# Patient Record
Sex: Male | Born: 1940 | Race: White | Hispanic: No | Marital: Married | Smoking: Former smoker
Health system: Southern US, Community
[De-identification: ages and names within clinical notes are randomized; demographics above are authoritative.]

## PROBLEM LIST (undated history)

## (undated) DIAGNOSIS — M109 Gout, unspecified: Secondary | ICD-10-CM

## (undated) DIAGNOSIS — I499 Cardiac arrhythmia, unspecified: Secondary | ICD-10-CM

## (undated) DIAGNOSIS — E039 Hypothyroidism, unspecified: Secondary | ICD-10-CM

## (undated) DIAGNOSIS — H9190 Unspecified hearing loss, unspecified ear: Secondary | ICD-10-CM

## (undated) DIAGNOSIS — I10 Essential (primary) hypertension: Secondary | ICD-10-CM

## (undated) DIAGNOSIS — E119 Type 2 diabetes mellitus without complications: Secondary | ICD-10-CM

## (undated) DIAGNOSIS — G4730 Sleep apnea, unspecified: Secondary | ICD-10-CM

## (undated) DIAGNOSIS — I4891 Unspecified atrial fibrillation: Secondary | ICD-10-CM

## (undated) DIAGNOSIS — K449 Diaphragmatic hernia without obstruction or gangrene: Secondary | ICD-10-CM

## (undated) DIAGNOSIS — K219 Gastro-esophageal reflux disease without esophagitis: Secondary | ICD-10-CM

## (undated) DIAGNOSIS — R011 Cardiac murmur, unspecified: Secondary | ICD-10-CM

## (undated) DIAGNOSIS — E785 Hyperlipidemia, unspecified: Secondary | ICD-10-CM

## (undated) HISTORY — PX: NO PAST SURGERIES: SHX2092

---

## 2004-02-02 ENCOUNTER — Encounter: Payer: Self-pay | Admitting: Family Medicine

## 2004-06-06 ENCOUNTER — Encounter: Payer: Self-pay | Admitting: Internal Medicine

## 2004-07-02 ENCOUNTER — Encounter: Payer: Self-pay | Admitting: Internal Medicine

## 2004-11-06 ENCOUNTER — Encounter: Payer: Self-pay | Admitting: Internal Medicine

## 2004-12-02 ENCOUNTER — Encounter: Payer: Self-pay | Admitting: Internal Medicine

## 2005-01-01 ENCOUNTER — Encounter: Payer: Self-pay | Admitting: Internal Medicine

## 2005-04-14 ENCOUNTER — Encounter: Payer: Self-pay | Admitting: Internal Medicine

## 2005-05-04 ENCOUNTER — Encounter: Payer: Self-pay | Admitting: Internal Medicine

## 2005-06-26 ENCOUNTER — Encounter: Payer: Self-pay | Admitting: Internal Medicine

## 2005-07-02 ENCOUNTER — Encounter: Payer: Self-pay | Admitting: Internal Medicine

## 2005-07-09 ENCOUNTER — Ambulatory Visit: Payer: Self-pay | Admitting: Internal Medicine

## 2005-09-03 ENCOUNTER — Ambulatory Visit: Payer: Self-pay | Admitting: Internal Medicine

## 2005-10-13 ENCOUNTER — Encounter: Payer: Self-pay | Admitting: Internal Medicine

## 2005-11-01 ENCOUNTER — Encounter: Payer: Self-pay | Admitting: Internal Medicine

## 2005-11-10 ENCOUNTER — Ambulatory Visit: Payer: Self-pay | Admitting: Gastroenterology

## 2006-02-02 ENCOUNTER — Encounter: Payer: Self-pay | Admitting: Internal Medicine

## 2006-02-04 ENCOUNTER — Ambulatory Visit: Payer: Self-pay | Admitting: Internal Medicine

## 2006-03-04 ENCOUNTER — Ambulatory Visit: Payer: Self-pay | Admitting: Internal Medicine

## 2006-03-04 ENCOUNTER — Encounter: Payer: Self-pay | Admitting: Internal Medicine

## 2006-04-03 ENCOUNTER — Ambulatory Visit: Payer: Self-pay | Admitting: Internal Medicine

## 2006-05-04 ENCOUNTER — Ambulatory Visit: Payer: Self-pay | Admitting: Internal Medicine

## 2006-06-04 ENCOUNTER — Encounter: Payer: Self-pay | Admitting: Internal Medicine

## 2006-07-03 ENCOUNTER — Encounter: Payer: Self-pay | Admitting: Internal Medicine

## 2006-11-12 ENCOUNTER — Encounter: Payer: Self-pay | Admitting: Internal Medicine

## 2006-12-03 ENCOUNTER — Encounter: Payer: Self-pay | Admitting: Internal Medicine

## 2007-05-13 ENCOUNTER — Encounter: Payer: Self-pay | Admitting: Internal Medicine

## 2007-06-05 ENCOUNTER — Encounter: Payer: Self-pay | Admitting: Internal Medicine

## 2010-01-27 ENCOUNTER — Other Ambulatory Visit: Payer: Self-pay | Admitting: Unknown Physician Specialty

## 2010-02-19 ENCOUNTER — Ambulatory Visit: Payer: Self-pay | Admitting: Gastroenterology

## 2011-04-05 ENCOUNTER — Emergency Department: Payer: Self-pay | Admitting: *Deleted

## 2011-09-02 DEATH — deceased

## 2012-01-27 ENCOUNTER — Ambulatory Visit: Payer: Self-pay | Admitting: Gastroenterology

## 2012-11-24 ENCOUNTER — Ambulatory Visit: Payer: Self-pay | Admitting: Internal Medicine

## 2013-01-27 ENCOUNTER — Ambulatory Visit: Payer: Self-pay | Admitting: Internal Medicine

## 2016-10-14 ENCOUNTER — Encounter: Payer: Self-pay | Admitting: *Deleted

## 2016-10-16 MED ORDER — SODIUM CHLORIDE 0.9 % IJ SOLN
INTRAMUSCULAR | Status: AC
  Filled 2016-10-16: qty 50

## 2016-10-16 MED ORDER — BUPIVACAINE-EPINEPHRINE (PF) 0.5% -1:200000 IJ SOLN
INTRAMUSCULAR | Status: AC
  Filled 2016-10-16: qty 30

## 2016-10-16 MED ORDER — HEPARIN SODIUM (PORCINE) 5000 UNIT/ML IJ SOLN
INTRAMUSCULAR | Status: AC
Start: 2016-10-16 — End: ?
  Filled 2016-10-16: qty 1

## 2016-10-16 MED ORDER — LIDOCAINE HCL (PF) 1 % IJ SOLN
INTRAMUSCULAR | Status: AC
  Filled 2016-10-16: qty 30

## 2016-10-20 ENCOUNTER — Ambulatory Visit
Admission: RE | Admit: 2016-10-20 | Discharge: 2016-10-20 | Disposition: A | Discharge status: Home / Self Care | Payer: Medicare Other | Source: Ambulatory Visit | Attending: Ophthalmology | Admitting: Ophthalmology

## 2016-10-20 ENCOUNTER — Ambulatory Visit: Payer: Medicare Other | Admitting: Certified Registered Nurse Anesthetist

## 2016-10-20 ENCOUNTER — Encounter
Admission: RE | Disposition: A | Discharge status: Home / Self Care | Payer: Self-pay | Source: Ambulatory Visit | Attending: Ophthalmology

## 2016-10-20 ENCOUNTER — Encounter: Payer: Self-pay | Admitting: *Deleted

## 2016-10-20 DIAGNOSIS — R011 Cardiac murmur, unspecified: Secondary | ICD-10-CM | POA: Insufficient documentation

## 2016-10-20 DIAGNOSIS — E7800 Pure hypercholesterolemia, unspecified: Secondary | ICD-10-CM | POA: Diagnosis not present

## 2016-10-20 DIAGNOSIS — Z7901 Long term (current) use of anticoagulants: Secondary | ICD-10-CM | POA: Insufficient documentation

## 2016-10-20 DIAGNOSIS — H2512 Age-related nuclear cataract, left eye: Secondary | ICD-10-CM | POA: Insufficient documentation

## 2016-10-20 DIAGNOSIS — Z9989 Dependence on other enabling machines and devices: Secondary | ICD-10-CM | POA: Diagnosis not present

## 2016-10-20 DIAGNOSIS — E119 Type 2 diabetes mellitus without complications: Secondary | ICD-10-CM | POA: Insufficient documentation

## 2016-10-20 DIAGNOSIS — I10 Essential (primary) hypertension: Secondary | ICD-10-CM | POA: Insufficient documentation

## 2016-10-20 DIAGNOSIS — I4891 Unspecified atrial fibrillation: Secondary | ICD-10-CM | POA: Insufficient documentation

## 2016-10-20 DIAGNOSIS — K219 Gastro-esophageal reflux disease without esophagitis: Secondary | ICD-10-CM | POA: Insufficient documentation

## 2016-10-20 DIAGNOSIS — G4730 Sleep apnea, unspecified: Secondary | ICD-10-CM | POA: Diagnosis not present

## 2016-10-20 DIAGNOSIS — E039 Hypothyroidism, unspecified: Secondary | ICD-10-CM | POA: Insufficient documentation

## 2016-10-20 DIAGNOSIS — Z87891 Personal history of nicotine dependence: Secondary | ICD-10-CM | POA: Insufficient documentation

## 2016-10-20 DIAGNOSIS — M109 Gout, unspecified: Secondary | ICD-10-CM | POA: Insufficient documentation

## 2016-10-20 DIAGNOSIS — Z79899 Other long term (current) drug therapy: Secondary | ICD-10-CM | POA: Diagnosis not present

## 2016-10-20 DIAGNOSIS — Z7984 Long term (current) use of oral hypoglycemic drugs: Secondary | ICD-10-CM | POA: Diagnosis not present

## 2016-10-20 HISTORY — DX: Unspecified atrial fibrillation: I48.91

## 2016-10-20 HISTORY — DX: Hypothyroidism, unspecified: E03.9

## 2016-10-20 HISTORY — DX: Unspecified hearing loss, unspecified ear: H91.90

## 2016-10-20 HISTORY — DX: Hyperlipidemia, unspecified: E78.5

## 2016-10-20 HISTORY — DX: Gastro-esophageal reflux disease without esophagitis: K21.9

## 2016-10-20 HISTORY — DX: Essential (primary) hypertension: I10

## 2016-10-20 HISTORY — DX: Diaphragmatic hernia without obstruction or gangrene: K44.9

## 2016-10-20 HISTORY — DX: Sleep apnea, unspecified: G47.30

## 2016-10-20 HISTORY — DX: Type 2 diabetes mellitus without complications: E11.9

## 2016-10-20 HISTORY — DX: Cardiac murmur, unspecified: R01.1

## 2016-10-20 HISTORY — PX: CATARACT EXTRACTION W/PHACO: SHX586

## 2016-10-20 HISTORY — DX: Hemochromatosis, unspecified: E83.119

## 2016-10-20 HISTORY — DX: Gout, unspecified: M10.9

## 2016-10-20 LAB — GLUCOSE, CAPILLARY: Glucose-Capillary: 131 mg/dL — ABNORMAL HIGH (ref 65–99)

## 2016-10-20 SURGERY — PHACOEMULSIFICATION, CATARACT, WITH IOL INSERTION
Anesthesia: Monitor Anesthesia Care | Site: Eye | Laterality: Left | Wound class: Clean

## 2016-10-20 MED ORDER — EPINEPHRINE PF 1 MG/ML IJ SOLN
INTRAOCULAR | Status: DC | PRN
  Administered 2016-10-20: 1 mL via OPHTHALMIC

## 2016-10-20 MED ORDER — ARMC OPHTHALMIC DILATING DROPS
1.0000 "application " | OPHTHALMIC | Status: AC
  Administered 2016-10-20 (×3): 1 via OPHTHALMIC

## 2016-10-20 MED ORDER — NA CHONDROIT SULF-NA HYALURON 40-17 MG/ML IO SOLN
INTRAOCULAR | Status: DC | PRN
  Administered 2016-10-20: 1 mL via INTRAOCULAR

## 2016-10-20 MED ORDER — MOXIFLOXACIN HCL 0.5 % OP SOLN
OPHTHALMIC | Status: AC
  Filled 2016-10-20: qty 3

## 2016-10-20 MED ORDER — MOXIFLOXACIN HCL 0.5 % OP SOLN: 1.0000 [drp] | OPHTHALMIC | Status: DC | PRN

## 2016-10-20 MED ORDER — POVIDONE-IODINE 5 % OP SOLN
OPHTHALMIC | Status: AC
  Filled 2016-10-20: qty 30

## 2016-10-20 MED ORDER — ARMC OPHTHALMIC DILATING DROPS
OPHTHALMIC | Status: AC
  Administered 2016-10-20: 1 via OPHTHALMIC
  Filled 2016-10-20: qty 0.4

## 2016-10-20 MED ORDER — MIDAZOLAM HCL 2 MG/2ML IJ SOLN
INTRAMUSCULAR | Status: DC | PRN
  Administered 2016-10-20 (×2): 1 mg via INTRAVENOUS

## 2016-10-20 MED ORDER — CARBACHOL 0.01 % IO SOLN
INTRAOCULAR | Status: DC | PRN
  Administered 2016-10-20: .5 mL via INTRAOCULAR

## 2016-10-20 MED ORDER — MOXIFLOXACIN HCL 0.5 % OP SOLN
OPHTHALMIC | Status: DC | PRN
  Administered 2016-10-20: .2 mL via OPHTHALMIC

## 2016-10-20 MED ORDER — LIDOCAINE HCL (PF) 4 % IJ SOLN
INTRAOCULAR | Status: DC | PRN
  Administered 2016-10-20: 2 mL via OPHTHALMIC

## 2016-10-20 MED ORDER — MIDAZOLAM HCL 2 MG/2ML IJ SOLN
INTRAMUSCULAR | Status: AC
  Filled 2016-10-20: qty 2

## 2016-10-20 MED ORDER — LABETALOL HCL 5 MG/ML IV SOLN
INTRAVENOUS | Status: DC | PRN
  Administered 2016-10-20 (×2): 10 mg via INTRAVENOUS

## 2016-10-20 MED ORDER — EPINEPHRINE PF 1 MG/ML IJ SOLN
INTRAMUSCULAR | Status: AC
Start: 2016-10-20 — End: ?
  Filled 2016-10-20: qty 2

## 2016-10-20 MED ORDER — FENTANYL CITRATE (PF) 100 MCG/2ML IJ SOLN: 25.0000 ug | INTRAMUSCULAR | Status: DC | PRN

## 2016-10-20 MED ORDER — ONDANSETRON HCL 4 MG/2ML IJ SOLN: 4.0000 mg | Freq: Once | INTRAMUSCULAR | Status: DC | PRN

## 2016-10-20 MED ORDER — NA CHONDROIT SULF-NA HYALURON 40-17 MG/ML IO SOLN
INTRAOCULAR | Status: AC
  Filled 2016-10-20: qty 1

## 2016-10-20 MED ORDER — SODIUM CHLORIDE 0.9 % IV SOLN
INTRAVENOUS | Status: DC
  Administered 2016-10-20: 20 mL/h via INTRAVENOUS

## 2016-10-20 MED ORDER — LIDOCAINE HCL (PF) 4 % IJ SOLN
INTRAMUSCULAR | Status: AC
  Filled 2016-10-20: qty 5

## 2016-10-20 MED ORDER — FENTANYL CITRATE (PF) 100 MCG/2ML IJ SOLN
INTRAMUSCULAR | Status: AC
  Filled 2016-10-20: qty 2

## 2016-10-20 MED ORDER — POVIDONE-IODINE 5 % OP SOLN
OPHTHALMIC | Status: DC | PRN
  Administered 2016-10-20: 1 via OPHTHALMIC

## 2016-10-20 SURGICAL SUPPLY — 14 items
GLOVE BIO SURGEON STRL SZ8 (GLOVE) ×3 IMPLANT
GLOVE BIOGEL M 6.5 STRL (GLOVE) ×3 IMPLANT
GLOVE SURG LX 8.0 MICRO (GLOVE) ×2
GLOVE SURG LX STRL 8.0 MICRO (GLOVE) ×1 IMPLANT
GOWN STRL REUS W/ TWL LRG LVL3 (GOWN DISPOSABLE) ×2 IMPLANT
GOWN STRL REUS W/TWL LRG LVL3 (GOWN DISPOSABLE) ×4
LENS IOL TECNIS ITEC 21.5 (Intraocular Lens) ×3 IMPLANT
PACK CATARACT (MISCELLANEOUS) ×3 IMPLANT
PACK CATARACT BRASINGTON LX (MISCELLANEOUS) ×3 IMPLANT
SOL BSS BAG (MISCELLANEOUS) ×3
SOLUTION BSS BAG (MISCELLANEOUS) ×1 IMPLANT
SYR 5ML LL (SYRINGE) ×3 IMPLANT
WATER STERILE IRR 250ML POUR (IV SOLUTION) ×3 IMPLANT
WIPE NON LINTING 3.25X3.25 (MISCELLANEOUS) ×3 IMPLANT

## 2016-10-20 NOTE — Op Note (Signed)
PREOPERATIVE DIAGNOSIS:  Nuclear sclerotic cataract of the left eye.   POSTOPERATIVE DIAGNOSIS:  Nuclear sclerotic cataract of the left eye.   OPERATIVE PROCEDURE: Procedure(s): CATARACT EXTRACTION PHACO AND INTRAOCULAR LENS PLACEMENT (IOC)   SURGEON:  Galen ManilaWilliam Bruno Leach, MD.   ANESTHESIA:  Anesthesiologist: Yves Dillarroll, Paul, MD CRNA: Almeta MonasFletcher, Patricia, CRNA  1.      Managed anesthesia care. 2.     0.961ml of Shugarcaine was instilled following the paracentesis   COMPLICATIONS:  None.   TECHNIQUE:   Stop and chop   DESCRIPTION OF PROCEDURE:  The patient was examined and consented in the preoperative holding area where the aforementioned topical anesthesia was applied to the left eye and then brought back to the Operating Room where the left eye was prepped and draped in the usual sterile ophthalmic fashion and a lid speculum was placed. A paracentesis was created with the side port blade and the anterior chamber was filled with viscoelastic. A near clear corneal incision was performed with the steel keratome. A continuous curvilinear capsulorrhexis was performed with a cystotome followed by the capsulorrhexis forceps. Hydrodissection and hydrodelineation were carried out with BSS on a blunt cannula. The lens was removed in a stop and chop  technique and the remaining cortical material was removed with the irrigation-aspiration handpiece. The capsular bag was inflated with viscoelastic and the Technis ZCB00 lens was placed in the capsular bag without complication. The remaining viscoelastic was removed from the eye with the irrigation-aspiration handpiece. The wounds were hydrated. The anterior chamber was flushed with Miostat and the eye was inflated to physiologic pressure. 0.681ml Vigamox was placed in the anterior chamber. The wounds were found to be water tight. The eye was dressed with Vigamox. The patient was given protective glasses to wear throughout the day and a shield with which to sleep  tonight. The patient was also given drops with which to begin a drop regimen today and will follow-up with me in one day.  Implant Name Type Inv. Item Serial No. Manufacturer Lot No. LRB No. Used  LENS IOL DIOP 21.5 - [id number] Intraocular Lens LENS IOL DIOP 21.5 [id number] AMO   Left 1    Procedure(s) with comments: CATARACT EXTRACTION PHACO AND INTRAOCULAR LENS PLACEMENT (IOC) (Left) - US 00:59 AP% 11.4 CDE 6.78 fluid pack lot # [id number]  Electronically signed: Monterius Rolf LOUIS 10/20/2016 7:59 AM

## 2016-10-20 NOTE — Anesthesia Postprocedure Evaluation (Signed)
Anesthesia Post Note  Patient: LANDRUM CARBONELL  Procedure(s) Performed: Procedure(s) (LRB): CATARACT EXTRACTION PHACO AND INTRAOCULAR LENS PLACEMENT (IOC) (Left)  Patient location during evaluation: PACU Anesthesia Type: MAC Level of consciousness: awake and alert and oriented Pain management: pain level controlled Vital Signs Assessment: post-procedure vital signs reviewed and stable Respiratory status: spontaneous breathing Cardiovascular status: blood pressure returned to baseline Anesthetic complications: no     Last Vitals:  Vitals:   10/20/16 0803 10/20/16 0810  BP: 136/85 (!) 152/92  Pulse: 84   Resp: 16   Temp: (!) 35.6 C     Last Pain:  Vitals:   10/20/16 0623  TempSrc: Tympanic                 Sadi Arave

## 2016-10-20 NOTE — H&P (Signed)
All labs reviewed. Abnormal studies sent to patients PCP when indicated.  Previous H&P reviewed, patient examined, there are NO CHANGES.  Chad Herring LOUIS6/19/20187:18 AM

## 2016-10-20 NOTE — Anesthesia Preprocedure Evaluation (Signed)
Anesthesia Evaluation  Patient identified by MRN, date of birth, ID band  Airway Mallampati: II  TM Distance: <3 FB     Dental  (+) Caps   Pulmonary sleep apnea , former smoker,    Pulmonary exam normal        Cardiovascular hypertension, Pt. on medications Normal cardiovascular exam+ dysrhythmias Atrial Fibrillation + Valvular Problems/Murmurs      Neuro/Psych    GI/Hepatic   Endo/Other  diabetes, Well Controlled, Type 2Hypothyroidism   Renal/GU      Musculoskeletal   Abdominal Normal abdominal exam  (+)   Peds  Hematology   Anesthesia Other Findings   Reproductive/Obstetrics                             Anesthesia Physical Anesthesia Plan  ASA: III  Anesthesia Plan: MAC   Post-op Pain Management:    Induction: Intravenous  PONV Risk Score and Plan:   Airway Management Planned: Nasal Cannula  Additional Equipment:   Intra-op Plan:   Post-operative Plan:   Informed Consent:   Plan Discussed with: CRNA and Surgeon  Anesthesia Plan Comments:         Anesthesia Quick Evaluation

## 2016-10-20 NOTE — Transfer of Care (Signed)
Immediate Anesthesia Transfer of Care Note  Patient: Chad Herring  Procedure(s) Performed: Procedure(s) with comments: CATARACT EXTRACTION PHACO AND INTRAOCULAR LENS PLACEMENT (IOC) (Left) - [place] 00:59 AP% 11.4 CDE 6.78 fluid pack lot # [id number]  Patient Location: PACU  Anesthesia Type:MAC  Level of Consciousness: alert   Airway & Oxygen Therapy: Patient Spontanous Breathing and Patient connected to nasal cannula oxygen  Post-op Assessment: Report given to RN and Post -op Vital signs reviewed and stable  Post vital signs: Reviewed and stable  Last Vitals:  Vitals:   10/14/16 1020 10/20/16 0623  BP: (!) 149/90 (!) 147/89  Pulse: 91 98  Resp:  15  Temp:  (!) 35.9 C    Last Pain:  Vitals:   10/20/16 0623  TempSrc: Tympanic      Patients Stated Pain Goal: 0 (27/63/94 3200)  Complications: No apparent anesthesia complications

## 2016-10-20 NOTE — Discharge Instructions (Signed)
Eye Surgery Discharge Instructions  Expect mild scratchy sensation or mild soreness. DO NOT RUB YOUR EYE!  The day of surgery:  Minimal physical activity, but bed rest is not required  No reading, computer work, or close hand work  No bending, lifting, or straining.  May watch TV  For 24 hours:  No driving, legal decisions, or alcoholic beverages  Safety precautions  Eat anything you prefer: It is better to start with liquids, then soup then solid foods.  _____ Eye patch should be worn until postoperative exam tomorrow.  ____ Solar shield eyeglasses should be worn for comfort in the sunlight/patch while sleeping  Resume all regular medications including aspirin or Coumadin if these were discontinued prior to surgery. You may shower, bathe, shave, or wash your hair. Tylenol may be taken for mild discomfort.  Call your doctor if you experience significant pain, nausea, or vomiting, fever > 101 or other signs of infection. [phone] or [phone] Specific instructions:  Follow-up Information    Galen ManilaPorfilio, William, MD Follow up.   Specialty:  Ophthalmology Why:  Eulah CitizenJune20 at 8:25am Contact information: [address] [place] [address] [phone]

## 2016-10-20 NOTE — Anesthesia Post-op Follow-up Note (Cosign Needed)
Anesthesia QCDR form completed.        

## 2016-11-02 ENCOUNTER — Encounter: Payer: Self-pay | Admitting: *Deleted

## 2016-11-10 ENCOUNTER — Ambulatory Visit: Payer: Medicare Other | Admitting: Anesthesiology

## 2016-11-10 ENCOUNTER — Encounter
Admission: RE | Disposition: A | Discharge status: Home / Self Care | Payer: Self-pay | Source: Ambulatory Visit | Attending: Ophthalmology

## 2016-11-10 ENCOUNTER — Ambulatory Visit
Admission: RE | Admit: 2016-11-10 | Discharge: 2016-11-10 | Disposition: A | Discharge status: Home / Self Care | Payer: Medicare Other | Source: Ambulatory Visit | Attending: Ophthalmology | Admitting: Ophthalmology

## 2016-11-10 ENCOUNTER — Encounter: Payer: Self-pay | Admitting: *Deleted

## 2016-11-10 DIAGNOSIS — K449 Diaphragmatic hernia without obstruction or gangrene: Secondary | ICD-10-CM | POA: Insufficient documentation

## 2016-11-10 DIAGNOSIS — Z87891 Personal history of nicotine dependence: Secondary | ICD-10-CM | POA: Diagnosis not present

## 2016-11-10 DIAGNOSIS — K219 Gastro-esophageal reflux disease without esophagitis: Secondary | ICD-10-CM | POA: Insufficient documentation

## 2016-11-10 DIAGNOSIS — I10 Essential (primary) hypertension: Secondary | ICD-10-CM | POA: Insufficient documentation

## 2016-11-10 DIAGNOSIS — E039 Hypothyroidism, unspecified: Secondary | ICD-10-CM | POA: Insufficient documentation

## 2016-11-10 DIAGNOSIS — G4730 Sleep apnea, unspecified: Secondary | ICD-10-CM | POA: Insufficient documentation

## 2016-11-10 DIAGNOSIS — I4891 Unspecified atrial fibrillation: Secondary | ICD-10-CM | POA: Insufficient documentation

## 2016-11-10 DIAGNOSIS — E1136 Type 2 diabetes mellitus with diabetic cataract: Secondary | ICD-10-CM | POA: Diagnosis not present

## 2016-11-10 DIAGNOSIS — Z79899 Other long term (current) drug therapy: Secondary | ICD-10-CM | POA: Diagnosis not present

## 2016-11-10 DIAGNOSIS — M109 Gout, unspecified: Secondary | ICD-10-CM | POA: Diagnosis not present

## 2016-11-10 HISTORY — DX: Cardiac arrhythmia, unspecified: I49.9

## 2016-11-10 HISTORY — PX: CATARACT EXTRACTION W/PHACO: SHX586

## 2016-11-10 LAB — GLUCOSE, CAPILLARY: GLUCOSE-CAPILLARY: 148 mg/dL — AB (ref 65–99)

## 2016-11-10 SURGERY — PHACOEMULSIFICATION, CATARACT, WITH IOL INSERTION
Anesthesia: Monitor Anesthesia Care | Site: Eye | Laterality: Right | Wound class: Clean

## 2016-11-10 MED ORDER — ARMC OPHTHALMIC DILATING DROPS
OPHTHALMIC | Status: AC
  Administered 2016-11-10: 08:00:00
  Filled 2016-11-10: qty 0.4

## 2016-11-10 MED ORDER — OXYCODONE HCL 5 MG/5ML PO SOLN: 5.0000 mg | Freq: Once | ORAL | Status: DC | PRN

## 2016-11-10 MED ORDER — LIDOCAINE HCL (PF) 4 % IJ SOLN
INTRAOCULAR | Status: DC | PRN
  Administered 2016-11-10: 4 mL via OPHTHALMIC

## 2016-11-10 MED ORDER — MOXIFLOXACIN HCL 0.5 % OP SOLN
OPHTHALMIC | Status: AC
  Filled 2016-11-10: qty 3

## 2016-11-10 MED ORDER — POVIDONE-IODINE 5 % OP SOLN
OPHTHALMIC | Status: DC | PRN
  Administered 2016-11-10: 1 via OPHTHALMIC

## 2016-11-10 MED ORDER — FENTANYL CITRATE (PF) 100 MCG/2ML IJ SOLN
INTRAMUSCULAR | Status: AC
  Filled 2016-11-10: qty 2

## 2016-11-10 MED ORDER — FENTANYL CITRATE (PF) 100 MCG/2ML IJ SOLN
INTRAMUSCULAR | Status: DC | PRN
  Administered 2016-11-10: 50 ug via INTRAVENOUS

## 2016-11-10 MED ORDER — MIDAZOLAM HCL 2 MG/2ML IJ SOLN
INTRAMUSCULAR | Status: AC
  Filled 2016-11-10: qty 2

## 2016-11-10 MED ORDER — MIDAZOLAM HCL 2 MG/2ML IJ SOLN
INTRAMUSCULAR | Status: DC | PRN
  Administered 2016-11-10: 1 mg via INTRAVENOUS

## 2016-11-10 MED ORDER — FENTANYL CITRATE (PF) 100 MCG/2ML IJ SOLN: 25.0000 ug | INTRAMUSCULAR | Status: DC | PRN

## 2016-11-10 MED ORDER — MOXIFLOXACIN HCL 0.5 % OP SOLN
OPHTHALMIC | Status: DC | PRN
  Administered 2016-11-10: 0.2 mL via OPHTHALMIC

## 2016-11-10 MED ORDER — SODIUM CHLORIDE 0.9 % IV SOLN
INTRAVENOUS | Status: DC
  Administered 2016-11-10: 07:00:00 via INTRAVENOUS

## 2016-11-10 MED ORDER — ARMC OPHTHALMIC DILATING DROPS
1.0000 "application " | OPHTHALMIC | Status: AC
  Administered 2016-11-10 (×2): 1 via OPHTHALMIC

## 2016-11-10 MED ORDER — OXYCODONE HCL 5 MG PO TABS: 5.0000 mg | ORAL_TABLET | Freq: Once | ORAL | Status: DC | PRN

## 2016-11-10 MED ORDER — EPINEPHRINE PF 1 MG/ML IJ SOLN
INTRAMUSCULAR | Status: DC | PRN
  Administered 2016-11-10: 08:00:00 via OPHTHALMIC

## 2016-11-10 MED ORDER — CARBACHOL 0.01 % IO SOLN
INTRAOCULAR | Status: DC | PRN
  Administered 2016-11-10: 0.5 mL via INTRAOCULAR

## 2016-11-10 MED ORDER — NA CHONDROIT SULF-NA HYALURON 40-17 MG/ML IO SOLN
INTRAOCULAR | Status: DC | PRN
  Administered 2016-11-10: 1 mL via INTRAOCULAR

## 2016-11-10 MED ORDER — MOXIFLOXACIN HCL 0.5 % OP SOLN: 1.0000 [drp] | OPHTHALMIC | Status: DC | PRN

## 2016-11-10 SURGICAL SUPPLY — 16 items

## 2016-11-10 NOTE — Anesthesia Preprocedure Evaluation (Signed)
Anesthesia Evaluation  Patient identified by MRN, date of birth, ID band  Airway Mallampati: II  TM Distance: <3 FB Neck ROM: limited    Dental  (+) Caps   Pulmonary sleep apnea , former smoker,    Pulmonary exam normal        Cardiovascular hypertension, Pt. on medications Normal cardiovascular exam+ dysrhythmias Atrial Fibrillation + Valvular Problems/Murmurs      Neuro/Psych  Neuromuscular disease    GI/Hepatic hiatal hernia, GERD  ,  Endo/Other  diabetes, Well Controlled, Type 2Hypothyroidism   Renal/GU      Musculoskeletal   Abdominal Normal abdominal exam  (+)   Peds  Hematology   Anesthesia Other Findings Past Medical History: No date: Atrial fibrillation (HCC) No date: Diabetes mellitus without complication (HCC) No date: Dysrhythmia No date: Elevated lipids No date: GERD (gastroesophageal reflux disease) No date: Gout No date: Heart murmur     Comment: atrial fibrillation....coumadin No date: Hemochromatosis No date: HH (hiatus hernia) No date: HOH (hard of hearing) No date: Hypertension No date: Hypothyroidism No date: Sleep apnea     Comment: uses cpap   Reproductive/Obstetrics                             Anesthesia Physical  Anesthesia Plan  ASA: III  Anesthesia Plan: MAC   Post-op Pain Management:    Induction: Intravenous  PONV Risk Score and Plan:   Airway Management Planned: Nasal Cannula  Additional Equipment:   Intra-op Plan:   Post-operative Plan:   Informed Consent:   Plan Discussed with: CRNA and Surgeon  Anesthesia Plan Comments:         Anesthesia Quick Evaluation

## 2016-11-10 NOTE — Anesthesia Procedure Notes (Signed)
Procedure Name: MAC Date/Time: 11/10/2016 7:54 AM Performed by: Hedda Slade Pre-anesthesia Checklist: Patient identified, Emergency Drugs available, Suction available and Patient being monitored Oxygen Delivery Method: Nasal cannula

## 2016-11-10 NOTE — Op Note (Signed)
PREOPERATIVE DIAGNOSIS:  Nuclear sclerotic cataract of the right eye.   POSTOPERATIVE DIAGNOSIS:  NUCLEAR SCLEROTIC CATARACT RIGHT EYE   OPERATIVE PROCEDURE: Procedure(s): CATARACT EXTRACTION PHACO AND INTRAOCULAR LENS PLACEMENT (IOC)   SURGEON:  Galen ManilaWilliam Jachai Okazaki, MD.   ANESTHESIA:  Anesthesiologist: Piscitello, Cleda MccreedyJoseph K, MD CRNA: Karoline CaldwellStarr, Deana, CRNA  1.      Managed anesthesia care. 2.      0.51ml of Shugarcaine was instilled in the eye following the paracentesis.   COMPLICATIONS:  None.   TECHNIQUE:   Stop and chop   DESCRIPTION OF PROCEDURE:  The patient was examined and consented in the preoperative holding area where the aforementioned topical anesthesia was applied to the right eye and then brought back to the Operating Room where the right eye was prepped and draped in the usual sterile ophthalmic fashion and a lid speculum was placed. A paracentesis was created with the side port blade and the anterior chamber was filled with viscoelastic. A near clear corneal incision was performed with the steel keratome. A continuous curvilinear capsulorrhexis was performed with a cystotome followed by the capsulorrhexis forceps. Hydrodissection and hydrodelineation were carried out with BSS on a blunt cannula. The lens was removed in a stop and chop  technique and the remaining cortical material was removed with the irrigation-aspiration handpiece. The capsular bag was inflated with viscoelastic and the Technis ZCB00  lens was placed in the capsular bag without complication. The remaining viscoelastic was removed from the eye with the irrigation-aspiration handpiece. The wounds were hydrated. The anterior chamber was flushed with Miostat and the eye was inflated to physiologic pressure. 0.811ml of Vigamox was placed in the anterior chamber. The wounds were found to be water tight. The eye was dressed with Vigamox. The patient was given protective glasses to wear throughout the day and a shield with which  to sleep tonight. The patient was also given drops with which to begin a drop regimen today and will follow-up with me in one day.  Implant Name Type Inv. Item Serial No. Manufacturer Lot No. LRB No. Used  LENS IOL DIOP 21.5 - [id number] Intraocular Lens LENS IOL DIOP 21.5 [id number] AMO   Right 1   Procedure(s) with comments: CATARACT EXTRACTION PHACO AND INTRAOCULAR LENS PLACEMENT (IOC) (Right) - US 01:14.5 AP% 13.1 CDE 9.77 Fluid Pack Lot # [id number]  Electronically signed: Karia Ehresman [place] 11/10/2016 8:10 AM

## 2016-11-10 NOTE — Anesthesia Post-op Follow-up Note (Cosign Needed)
Anesthesia QCDR form completed.        

## 2016-11-10 NOTE — Anesthesia Postprocedure Evaluation (Signed)
Anesthesia Post Note  Patient: Chad Herring  Procedure(s) Performed: Procedure(s) (LRB): CATARACT EXTRACTION PHACO AND INTRAOCULAR LENS PLACEMENT (IOC) (Right)  Patient location during evaluation: PACU Anesthesia Type: MAC Level of consciousness: awake Pain management: pain level controlled Vital Signs Assessment: post-procedure vital signs reviewed and stable Respiratory status: spontaneous breathing Cardiovascular status: blood pressure returned to baseline Postop Assessment: no signs of nausea or vomiting Anesthetic complications: no     Last Vitals:  Vitals:   11/10/16 0635 11/10/16 0812  BP: (!) 152/81 (!) 146/84  Pulse: 81 74  Resp: 16 12  Temp: 36.4 C 36.5 C    Last Pain:  Vitals:   11/10/16 0812  TempSrc: Oral                 Hedda Slade

## 2016-11-10 NOTE — Discharge Instructions (Signed)
Eye Surgery Discharge Instructions  Expect mild scratchy sensation or mild soreness. DO NOT RUB YOUR EYE!  The day of surgery:  Minimal physical activity, but bed rest is not required  No reading, computer work, or close hand work  No bending, lifting, or straining.  May watch TV  For 24 hours:  No driving, legal decisions, or alcoholic beverages  Safety precautions  Eat anything you prefer: It is better to start with liquids, then soup then solid foods.  _____ Eye patch should be worn until postoperative exam tomorrow.  ____ Solar shield eyeglasses should be worn for comfort in the sunlight/patch while sleeping  Resume all regular medications including aspirin or Coumadin if these were discontinued prior to surgery. You may shower, bathe, shave, or wash your hair. Tylenol may be taken for mild discomfort.  Call your doctor if you experience significant pain, nausea, or vomiting, fever > 101 or other signs of infection. [phone] or [phone] Specific instructions:  Follow-up Information    Galen ManilaPorfilio, William, MD Follow up.   Specialty:  Ophthalmology Why:  July 11 at 9:05am Contact information: [address] [place] [address] [phone]

## 2016-11-10 NOTE — H&P (Signed)
All labs reviewed. Abnormal studies sent to patients PCP when indicated.  Previous H&P reviewed, patient examined, there are NO CHANGES.  Cesar Alf LOUIS7/10/20187:46 AM

## 2016-11-10 NOTE — Transfer of Care (Signed)
Immediate Anesthesia Transfer of Care Note  Patient: Chad Herring  Procedure(s) Performed: Procedure(s) with comments: CATARACT EXTRACTION PHACO AND INTRAOCULAR LENS PLACEMENT (IOC) (Right) - [place] 01:14.5 AP% 13.1 CDE 9.77 Fluid Pack Lot # [id number]  Patient Location: PACU  Anesthesia Type:MAC  Level of Consciousness: awake, alert  and oriented  Airway & Oxygen Therapy: Patient Spontanous Breathing  Post-op Assessment: Report given to RN and Post -op Vital signs reviewed and stable  Post vital signs: Reviewed and stable  Last Vitals:  Vitals:   11/10/16 0635 11/10/16 0812  BP: (!) 152/81 (!) 146/84  Pulse: 81 74  Resp: 16 12  Temp: 36.4 C 36.5 C    Last Pain:  Vitals:   11/10/16 0812  TempSrc: Oral         Complications: No apparent anesthesia complications

## 2020-03-26 ENCOUNTER — Other Ambulatory Visit: Payer: Self-pay | Admitting: Internal Medicine

## 2020-03-26 DIAGNOSIS — G459 Transient cerebral ischemic attack, unspecified: Secondary | ICD-10-CM

## 2020-04-05 ENCOUNTER — Other Ambulatory Visit: Payer: Self-pay | Admitting: Internal Medicine

## 2020-04-05 DIAGNOSIS — G459 Transient cerebral ischemic attack, unspecified: Secondary | ICD-10-CM

## 2020-04-09 ENCOUNTER — Other Ambulatory Visit: Payer: Self-pay

## 2020-04-09 ENCOUNTER — Ambulatory Visit
Admission: RE | Admit: 2020-04-09 | Discharge: 2020-04-09 | Disposition: A | Discharge status: Home / Self Care | Payer: Medicare Other | Source: Ambulatory Visit | Attending: Internal Medicine | Admitting: Internal Medicine

## 2020-04-09 DIAGNOSIS — G459 Transient cerebral ischemic attack, unspecified: Secondary | ICD-10-CM | POA: Insufficient documentation

## 2020-04-11 ENCOUNTER — Other Ambulatory Visit: Payer: Self-pay | Admitting: Internal Medicine

## 2020-04-11 ENCOUNTER — Other Ambulatory Visit (HOSPITAL_COMMUNITY): Payer: Self-pay | Admitting: Internal Medicine

## 2020-04-11 DIAGNOSIS — I6523 Occlusion and stenosis of bilateral carotid arteries: Secondary | ICD-10-CM

## 2020-04-16 ENCOUNTER — Other Ambulatory Visit: Payer: Self-pay

## 2020-04-16 ENCOUNTER — Ambulatory Visit
Admission: RE | Admit: 2020-04-16 | Discharge: 2020-04-16 | Disposition: A | Discharge status: Home / Self Care | Payer: Medicare Other | Source: Ambulatory Visit | Attending: Internal Medicine | Admitting: Internal Medicine

## 2020-04-16 DIAGNOSIS — G459 Transient cerebral ischemic attack, unspecified: Secondary | ICD-10-CM | POA: Insufficient documentation

## 2020-04-23 ENCOUNTER — Ambulatory Visit
Admission: RE | Admit: 2020-04-23 | Discharge: 2020-04-23 | Disposition: A | Discharge status: Home / Self Care | Payer: Medicare Other | Source: Ambulatory Visit | Attending: Internal Medicine | Admitting: Internal Medicine

## 2020-04-23 ENCOUNTER — Other Ambulatory Visit: Payer: Self-pay

## 2020-04-23 DIAGNOSIS — I6523 Occlusion and stenosis of bilateral carotid arteries: Secondary | ICD-10-CM | POA: Diagnosis present

## 2020-04-23 MED ORDER — IOHEXOL 350 MG/ML SOLN
100.0000 mL | Freq: Once | INTRAVENOUS | Status: AC | PRN
  Administered 2020-04-23: 12:00:00 75 mL via INTRAVENOUS

## 2020-04-25 ENCOUNTER — Other Ambulatory Visit: Payer: Self-pay | Admitting: Internal Medicine

## 2020-04-25 ENCOUNTER — Other Ambulatory Visit (HOSPITAL_COMMUNITY): Payer: Self-pay | Admitting: Internal Medicine

## 2020-04-25 DIAGNOSIS — R918 Other nonspecific abnormal finding of lung field: Secondary | ICD-10-CM

## 2020-05-13 ENCOUNTER — Ambulatory Visit: Payer: Medicare Other

## 2020-05-20 ENCOUNTER — Encounter (INDEPENDENT_AMBULATORY_CARE_PROVIDER_SITE_OTHER): Payer: Self-pay | Admitting: Vascular Surgery

## 2020-05-22 ENCOUNTER — Other Ambulatory Visit: Payer: Self-pay

## 2020-05-22 ENCOUNTER — Ambulatory Visit
Admission: RE | Admit: 2020-05-22 | Discharge: 2020-05-22 | Disposition: A | Discharge status: Home / Self Care | Payer: Medicare Other | Source: Ambulatory Visit | Attending: Internal Medicine | Admitting: Internal Medicine

## 2020-05-22 DIAGNOSIS — R918 Other nonspecific abnormal finding of lung field: Secondary | ICD-10-CM | POA: Insufficient documentation

## 2020-05-24 ENCOUNTER — Telehealth: Payer: Self-pay | Admitting: Pulmonary Disease

## 2020-05-24 NOTE — Telephone Encounter (Signed)
[facility]:  We were contacted by the [facility].  Recommendations/Concern: Increasing right upper lobe nodule Appointment requested: urgent referral recommended  Time frame: Next availble  Franne G[facility] 05/24/2020 12:35 PM

## 2020-05-24 NOTE — Telephone Encounter (Signed)
Called and left message on pt vm to call back to schedule next available lung nodule appt with RB or BI -pr

## 2020-05-24 NOTE — Telephone Encounter (Signed)
-----   Message from Novella Olive sent at 05/24/2020 12:02 PM EST ----- Regarding: Radiology Follow Up Suggestion of slight increase in size of a 2.4 x 1.7 cm ground-glass nodule within the right apex. Finding concerning for malignancy. Additional imaging evaluation or consultation with Pulmonology or Thoracic Surgery recommended.

## 2020-06-03 NOTE — Telephone Encounter (Signed)
Called and left message on pt vm to call back to schedule pulmonary consult for lung nodule. _ 2nd attempt-pr

## 2020-07-11 ENCOUNTER — Ambulatory Visit (INDEPENDENT_AMBULATORY_CARE_PROVIDER_SITE_OTHER): Payer: Self-pay | Admitting: Vascular Surgery

## 2021-06-18 ENCOUNTER — Other Ambulatory Visit: Payer: Self-pay | Admitting: Internal Medicine

## 2021-06-18 DIAGNOSIS — M25551 Pain in right hip: Secondary | ICD-10-CM

## 2021-06-30 ENCOUNTER — Ambulatory Visit
Admission: RE | Admit: 2021-06-30 | Discharge: 2021-06-30 | Disposition: A | Discharge status: Home / Self Care | Payer: Medicare Other | Source: Ambulatory Visit | Attending: Internal Medicine | Admitting: Internal Medicine

## 2021-06-30 ENCOUNTER — Other Ambulatory Visit: Payer: Self-pay

## 2021-06-30 DIAGNOSIS — M25551 Pain in right hip: Secondary | ICD-10-CM | POA: Insufficient documentation

## 2022-02-17 IMAGING — MR MR HEAD W/O CM
10 series · 48 of 48 positions shown · non-contrast
Comparison: None.

CLINICAL DATA: Transient ischemic attack.

EXAM:
MRI HEAD WITHOUT CONTRAST
TECHNIQUE: Multiplanar, multiecho pulse sequences of the brain and surrounding
structures were obtained without intravenous contrast.

[Series 2: T1 · sagittal · 5.0mm · 0.45mm/px · 3 of 23 slices shown (1 of 2)]
[im 1/23]
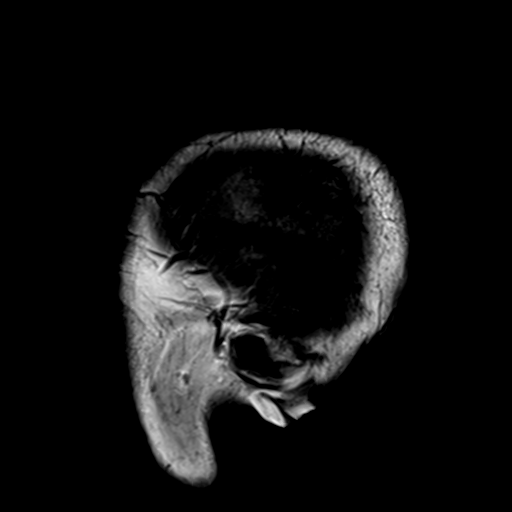
[im 12/23]
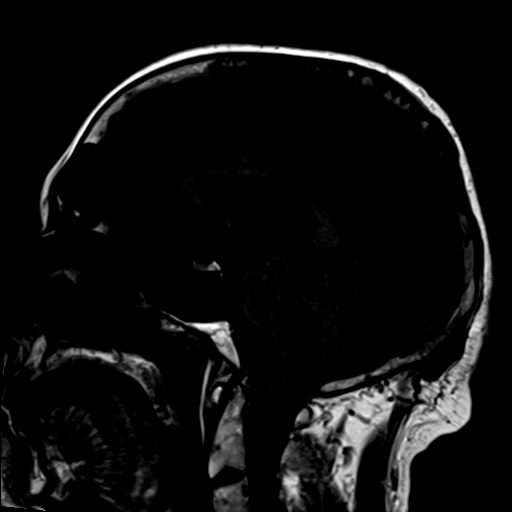
[im 23/23]
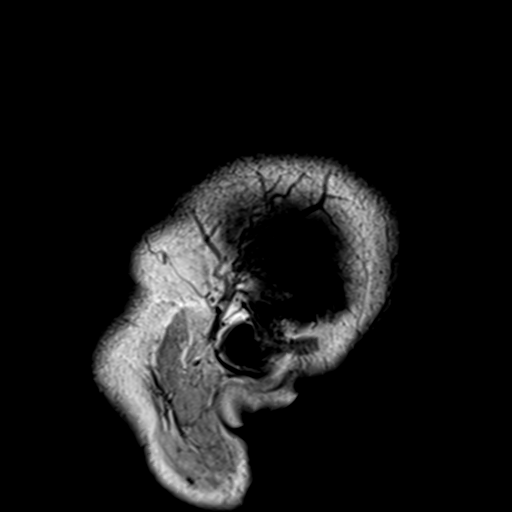

[Series 4: DWI · axial · 3.0mm · 1.20mm/px · z∈[-65,+94]mm · 5 of 55 slices shown (1 of 4)]
[im 1/55]
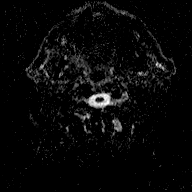
[im 14/55]
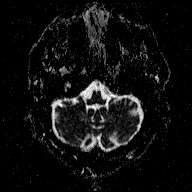
[im 28/55]
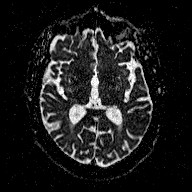
[im 41/55]
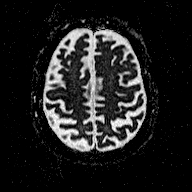
[im 55/55]
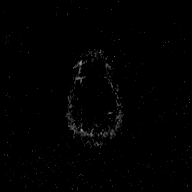

[Series 6: DWI · coronal · 3.0mm · 1.15mm/px · 4 of 48 slices shown (2 of 4)]
[im 1/48]
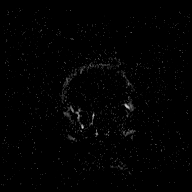
[im 16/48]
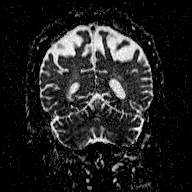
[im 32/48]
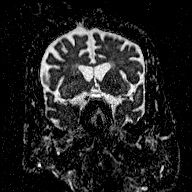
[im 48/48]
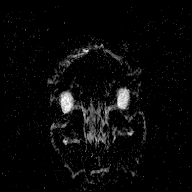

[Series 7: T2 · axial · 5.0mm · 0.72mm/px · z∈[-68,+97]mm · 2 of 25 slices shown (1 of 3)]
[im 1/25]
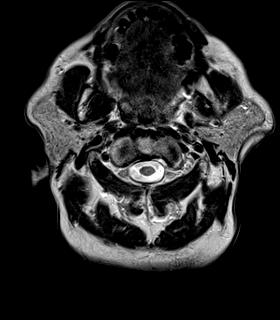
[im 25/25]
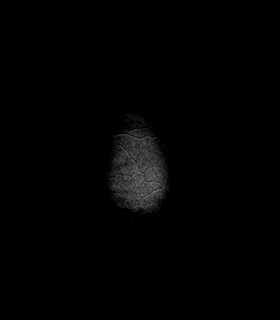

[Series 8: FLAIR · axial · 3.0mm · 0.45mm/px · z∈[-65,+94]mm · 5 of 55 slices shown]
[im 1/55]
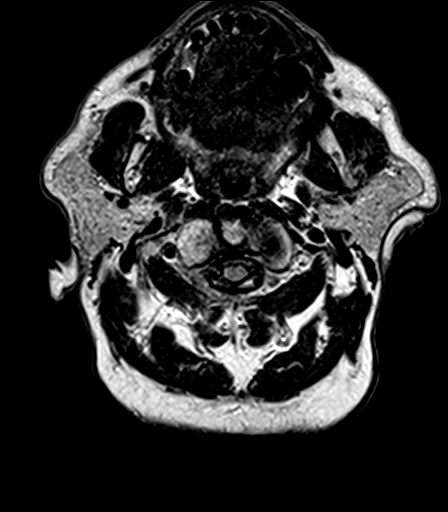
[im 14/55]
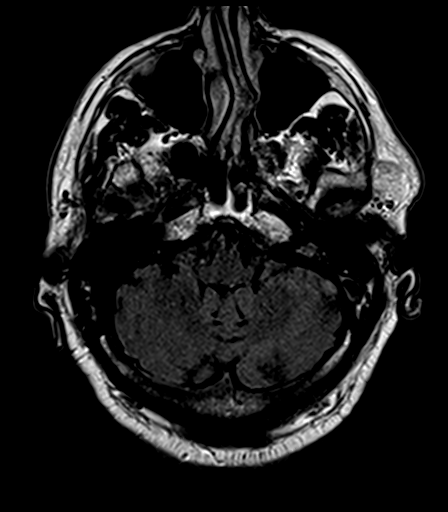
[im 28/55]
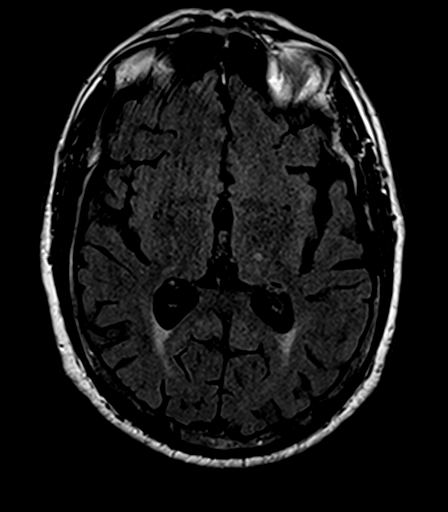
[im 41/55]
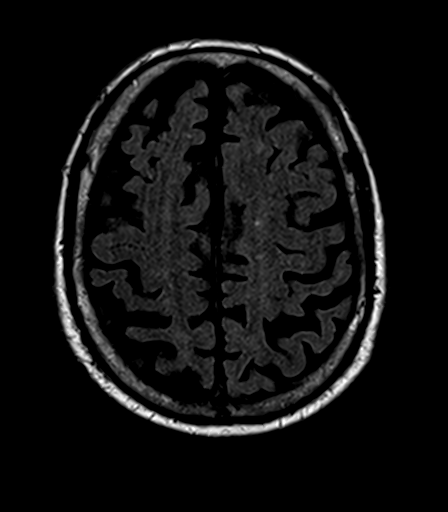
[im 55/55]
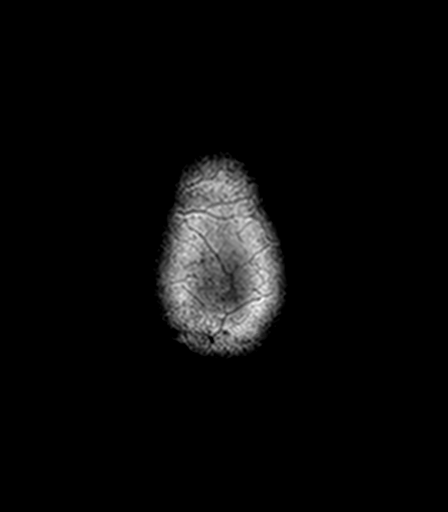

[Series 9: T2 · axial · 5.0mm · 0.72mm/px · z∈[-68,+97]mm · 2 of 25 slices shown (2 of 3)]
[im 1/25]
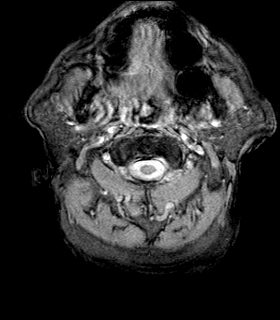
[im 25/25]
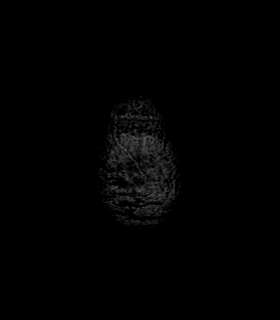

[Series 10: T1 · axial · 1.0mm · 1.00mm/px · z∈[-62,+95]mm · 15 of 160 slices shown (2 of 2)]
[im 1/160]
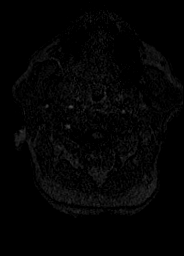
[im 12/160]
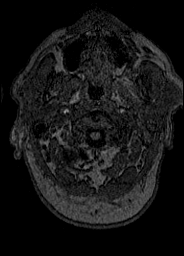
[im 23/160]
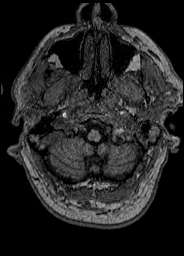
[im 35/160]
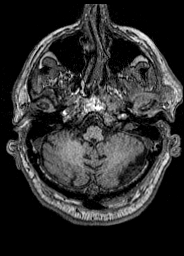
[im 46/160]
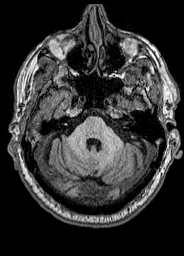
[im 57/160]
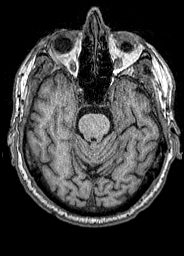
[im 69/160]
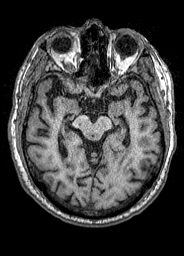
[im 80/160]
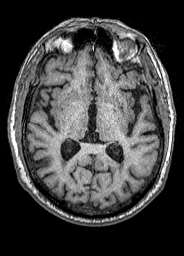
[im 91/160]
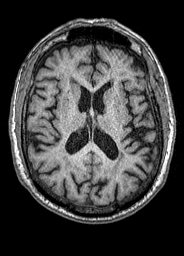
[im 103/160]
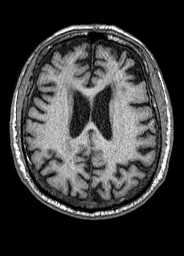
[im 114/160]
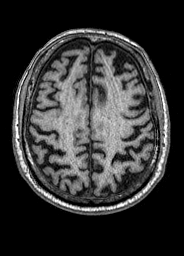
[im 125/160]
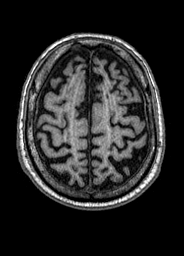
[im 137/160]
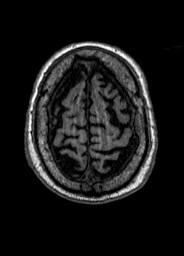
[im 148/160]
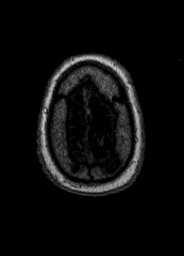
[im 160/160]
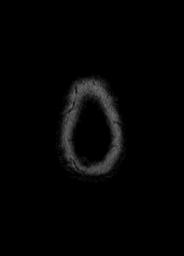

[Series 11: T2 · coronal · 5.0mm · 0.43mm/px · 3 of 31 slices shown (3 of 3)]
[im 1/31]
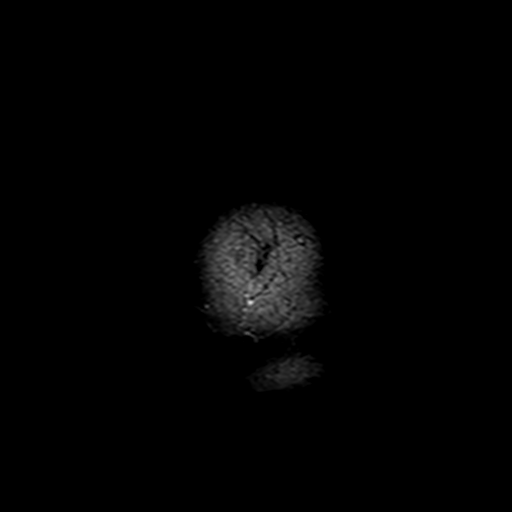
[im 16/31]
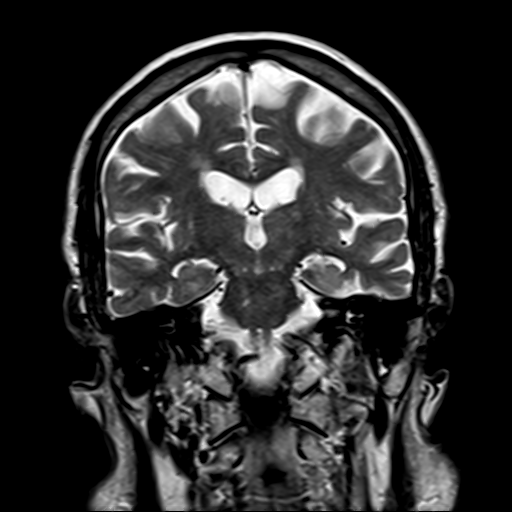
[im 31/31]
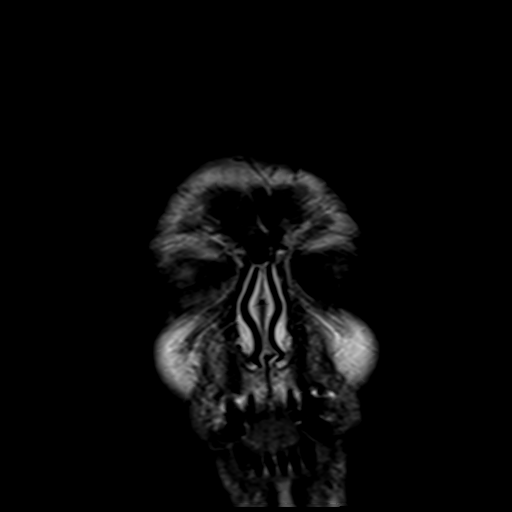

[Series 101: DWI · coronal · 3.0mm · 1.15mm/px · 4 of 48 slices shown (3 of 4)]
[im 1/48]
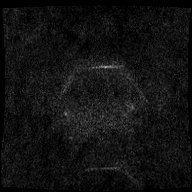
[im 16/48]
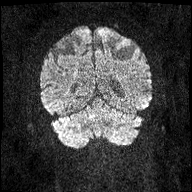
[im 32/48]
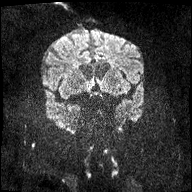
[im 48/48]
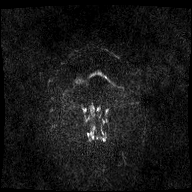

[Series 102: DWI · axial · 3.0mm · 1.20mm/px · z∈[-65,+94]mm · 5 of 55 slices shown (4 of 4)]
[im 1/55]
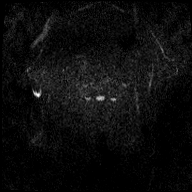
[im 14/55]
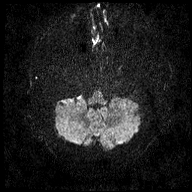
[im 28/55]
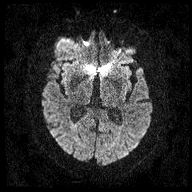
[im 41/55]
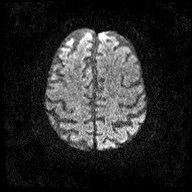
[im 55/55]
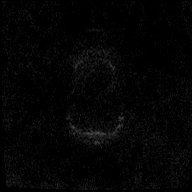

[48 of 48 positions shown; findings below may reference images not displayed]

FINDINGS: Brain: No acute infarction, hemorrhage, hydrocephalus, extra-axial
collection or mass lesion. Scattered and confluent foci of T2
hyperintensity are seen within the white matter of the cerebral
hemispheres, nonspecific, most likely related to chronic small
vessel ischemia. Moderate parenchymal volume loss.

Vascular: Absent flow void in the left vertebral artery may
represent occlusion versus slow flow.

Skull and upper cervical spine: T1 hypointense foci in the bilateral
parietal bone (series 2, images 9 and 15), measuring 1.9 cm on the
left and 0.9 cm on the right.

Sinuses/Orbits: Bilateral lens surgery.  Paranasal sinuses are clear

Other: Small left mastoid effusion.
IMPRESSION: 1. No acute intracranial abnormality.
2. Moderate parenchymal volume loss and moderate chronic small
vessel ischemia.
3. T1 hypointense foci in the bilateral parietal bone, nonspecific
but may represent osseous metastatic disease. Consider bone scan for
further evaluation.
4. Absent flow void in the left vertebral artery may represent
occlusion versus slow flow.

## 2022-06-12 ENCOUNTER — Other Ambulatory Visit
Admission: RE | Admit: 2022-06-12 | Discharge: 2022-06-12 | Disposition: A | Discharge status: Home / Self Care | Payer: Medicare Other | Attending: Ophthalmology | Admitting: Ophthalmology

## 2022-06-12 ENCOUNTER — Other Ambulatory Visit: Payer: Self-pay | Admitting: Ophthalmology

## 2022-06-12 DIAGNOSIS — G453 Amaurosis fugax: Secondary | ICD-10-CM | POA: Insufficient documentation

## 2022-06-12 LAB — CBC
HCT: 37.3 % — ABNORMAL LOW (ref 39.0–52.0)
Hemoglobin: 10.8 g/dL — ABNORMAL LOW (ref 13.0–17.0)
MCH: 20.7 pg — ABNORMAL LOW (ref 26.0–34.0)
MCHC: 29 g/dL — ABNORMAL LOW (ref 30.0–36.0)
MCV: 71.5 fL — ABNORMAL LOW (ref 80.0–100.0)
Platelets: 307 10*3/uL (ref 150–400)
RBC: 5.22 MIL/uL (ref 4.22–5.81)
RDW: 20.7 % — ABNORMAL HIGH (ref 11.5–15.5)
WBC: 9.5 10*3/uL (ref 4.0–10.5)
nRBC: 0 % (ref 0.0–0.2)

## 2022-06-12 LAB — SEDIMENTATION RATE: Sed Rate: 23 mm/hr — ABNORMAL HIGH (ref 0–20)

## 2022-06-12 LAB — C-REACTIVE PROTEIN: CRP: 0.7 mg/dL (ref <1.0)

## 2022-06-16 ENCOUNTER — Ambulatory Visit
Admission: RE | Admit: 2022-06-16 | Discharge: 2022-06-16 | Disposition: A | Discharge status: Home / Self Care | Payer: Medicare Other | Source: Ambulatory Visit | Attending: Ophthalmology | Admitting: Ophthalmology

## 2022-06-16 DIAGNOSIS — G453 Amaurosis fugax: Secondary | ICD-10-CM | POA: Diagnosis present

## 2022-08-03 DEATH — deceased
